# Patient Record
Sex: Female | Born: 1960 | Race: White | Hispanic: No | Marital: Single | Smoking: Never smoker
Health system: Southern US, Community
[De-identification: ages and names within clinical notes are randomized; demographics above are authoritative.]

## PROBLEM LIST (undated history)

## (undated) DIAGNOSIS — K219 Gastro-esophageal reflux disease without esophagitis: Secondary | ICD-10-CM

---

## 2004-09-20 ENCOUNTER — Ambulatory Visit: Payer: Self-pay | Admitting: Obstetrics and Gynecology

## 2004-09-26 ENCOUNTER — Emergency Department: Payer: Self-pay | Admitting: Emergency Medicine

## 2006-02-18 ENCOUNTER — Ambulatory Visit: Payer: Self-pay | Admitting: Obstetrics and Gynecology

## 2006-02-27 ENCOUNTER — Ambulatory Visit: Payer: Self-pay

## 2007-03-18 ENCOUNTER — Ambulatory Visit: Payer: Self-pay | Admitting: Obstetrics and Gynecology

## 2008-03-23 ENCOUNTER — Ambulatory Visit: Payer: Self-pay | Admitting: Obstetrics and Gynecology

## 2009-09-21 ENCOUNTER — Ambulatory Visit: Payer: Self-pay | Admitting: Obstetrics and Gynecology

## 2009-10-02 ENCOUNTER — Ambulatory Visit: Payer: Self-pay | Admitting: Obstetrics and Gynecology

## 2010-04-02 ENCOUNTER — Ambulatory Visit: Payer: Self-pay | Admitting: Obstetrics and Gynecology

## 2010-10-02 ENCOUNTER — Ambulatory Visit: Payer: Self-pay | Admitting: Obstetrics and Gynecology

## 2011-09-11 ENCOUNTER — Ambulatory Visit: Payer: Self-pay

## 2011-09-11 LAB — PREGNANCY, URINE: Pregnancy Test, Urine: NEGATIVE m[IU]/mL

## 2012-11-04 ENCOUNTER — Ambulatory Visit: Payer: Self-pay | Admitting: Obstetrics and Gynecology

## 2013-02-04 ENCOUNTER — Ambulatory Visit: Payer: Self-pay

## 2014-02-11 ENCOUNTER — Ambulatory Visit: Payer: Self-pay | Admitting: Obstetrics and Gynecology

## 2014-03-29 ENCOUNTER — Ambulatory Visit: Payer: Self-pay | Admitting: Physician Assistant

## 2015-07-27 ENCOUNTER — Other Ambulatory Visit: Payer: Self-pay

## 2016-06-03 ENCOUNTER — Other Ambulatory Visit: Payer: Self-pay | Admitting: Obstetrics and Gynecology

## 2016-06-03 DIAGNOSIS — Z1231 Encounter for screening mammogram for malignant neoplasm of breast: Secondary | ICD-10-CM

## 2016-06-05 ENCOUNTER — Ambulatory Visit
Admission: RE | Admit: 2016-06-05 | Discharge: 2016-06-05 | Disposition: A | Discharge status: Home / Self Care | Payer: BC Managed Care – PPO | Source: Ambulatory Visit | Attending: Obstetrics and Gynecology | Admitting: Obstetrics and Gynecology

## 2016-06-05 ENCOUNTER — Encounter: Payer: Self-pay | Admitting: Radiology

## 2016-06-05 DIAGNOSIS — R928 Other abnormal and inconclusive findings on diagnostic imaging of breast: Secondary | ICD-10-CM | POA: Diagnosis not present

## 2016-06-05 DIAGNOSIS — Z1231 Encounter for screening mammogram for malignant neoplasm of breast: Secondary | ICD-10-CM | POA: Diagnosis not present

## 2016-06-10 ENCOUNTER — Other Ambulatory Visit: Payer: Self-pay | Admitting: Obstetrics and Gynecology

## 2016-06-10 DIAGNOSIS — R928 Other abnormal and inconclusive findings on diagnostic imaging of breast: Secondary | ICD-10-CM

## 2016-06-27 ENCOUNTER — Ambulatory Visit: Payer: BC Managed Care – PPO

## 2016-06-27 ENCOUNTER — Other Ambulatory Visit: Payer: BC Managed Care – PPO

## 2016-06-27 HISTORY — PX: BREAST BIOPSY: SHX20

## 2016-07-05 ENCOUNTER — Other Ambulatory Visit: Payer: Self-pay | Admitting: *Deleted

## 2016-07-05 ENCOUNTER — Other Ambulatory Visit: Payer: Self-pay | Admitting: Obstetrics and Gynecology

## 2016-07-05 ENCOUNTER — Inpatient Hospital Stay
Admission: RE | Admit: 2016-07-05 | Discharge: 2016-07-05 | Disposition: A | Discharge status: Home / Self Care | Payer: Self-pay | Source: Ambulatory Visit | Attending: *Deleted | Admitting: *Deleted

## 2016-07-05 DIAGNOSIS — R928 Other abnormal and inconclusive findings on diagnostic imaging of breast: Secondary | ICD-10-CM

## 2016-07-05 DIAGNOSIS — Z9289 Personal history of other medical treatment: Secondary | ICD-10-CM

## 2016-07-16 ENCOUNTER — Ambulatory Visit
Admission: RE | Admit: 2016-07-16 | Discharge: 2016-07-16 | Disposition: A | Discharge status: Home / Self Care | Payer: BC Managed Care – PPO | Source: Ambulatory Visit | Attending: Obstetrics and Gynecology | Admitting: Obstetrics and Gynecology

## 2016-07-16 DIAGNOSIS — N6311 Unspecified lump in the right breast, upper outer quadrant: Secondary | ICD-10-CM | POA: Insufficient documentation

## 2016-07-16 DIAGNOSIS — R928 Other abnormal and inconclusive findings on diagnostic imaging of breast: Secondary | ICD-10-CM

## 2016-07-16 DIAGNOSIS — N6310 Unspecified lump in the right breast, unspecified quadrant: Secondary | ICD-10-CM | POA: Diagnosis present

## 2016-07-17 LAB — SURGICAL PATHOLOGY

## 2017-08-27 ENCOUNTER — Other Ambulatory Visit: Payer: Self-pay | Admitting: Obstetrics and Gynecology

## 2017-08-27 DIAGNOSIS — Z1231 Encounter for screening mammogram for malignant neoplasm of breast: Secondary | ICD-10-CM

## 2017-09-09 ENCOUNTER — Ambulatory Visit
Admission: RE | Admit: 2017-09-09 | Discharge: 2017-09-09 | Disposition: A | Discharge status: Home / Self Care | Payer: BC Managed Care – PPO | Source: Ambulatory Visit | Attending: Obstetrics and Gynecology | Admitting: Obstetrics and Gynecology

## 2017-09-09 DIAGNOSIS — Z1231 Encounter for screening mammogram for malignant neoplasm of breast: Secondary | ICD-10-CM | POA: Insufficient documentation

## 2019-11-03 ENCOUNTER — Other Ambulatory Visit: Payer: Self-pay | Admitting: Obstetrics and Gynecology

## 2019-11-03 DIAGNOSIS — N644 Mastodynia: Secondary | ICD-10-CM

## 2019-11-11 ENCOUNTER — Ambulatory Visit
Admission: RE | Admit: 2019-11-11 | Discharge: 2019-11-11 | Disposition: A | Discharge status: Home / Self Care | Payer: BC Managed Care – PPO | Source: Ambulatory Visit | Attending: Obstetrics and Gynecology | Admitting: Obstetrics and Gynecology

## 2019-11-11 DIAGNOSIS — N644 Mastodynia: Secondary | ICD-10-CM | POA: Insufficient documentation

## 2019-12-07 ENCOUNTER — Encounter: Payer: Self-pay | Admitting: Emergency Medicine

## 2019-12-07 ENCOUNTER — Other Ambulatory Visit: Payer: Self-pay

## 2019-12-07 ENCOUNTER — Ambulatory Visit
Admission: EM | Admit: 2019-12-07 | Discharge: 2019-12-07 | Disposition: A | Discharge status: Home / Self Care | Payer: BC Managed Care – PPO | Attending: Urgent Care | Admitting: Urgent Care

## 2019-12-07 DIAGNOSIS — R030 Elevated blood-pressure reading, without diagnosis of hypertension: Secondary | ICD-10-CM | POA: Diagnosis not present

## 2019-12-07 DIAGNOSIS — R079 Chest pain, unspecified: Secondary | ICD-10-CM

## 2019-12-07 HISTORY — DX: Gastro-esophageal reflux disease without esophagitis: K21.9

## 2019-12-07 LAB — BASIC METABOLIC PANEL
Anion gap: 9 (ref 5–15)
BUN: 14 mg/dL (ref 6–20)
CO2: 27 mmol/L (ref 22–32)
Calcium: 9.3 mg/dL (ref 8.9–10.3)
Chloride: 103 mmol/L (ref 98–111)
Creatinine, Ser: 0.95 mg/dL (ref 0.44–1.00)
GFR calc Af Amer: >60 mL/min (ref >60)
GFR calc non Af Amer: >60 mL/min (ref >60)
Glucose, Bld: 92 mg/dL (ref 70–99)
Potassium: 3.6 mmol/L (ref 3.5–5.1)
Sodium: 139 mmol/L (ref 135–145)

## 2019-12-07 LAB — CBC
HCT: 40.5 % (ref 36.0–46.0)
Hemoglobin: 13.5 g/dL (ref 12.0–15.0)
MCH: 31 pg (ref 26.0–34.0)
MCHC: 33.3 g/dL (ref 30.0–36.0)
MCV: 93.1 fL (ref 80.0–100.0)
Platelets: 341 10*3/uL (ref 150–400)
RBC: 4.35 MIL/uL (ref 3.87–5.11)
RDW: 12.6 % (ref 11.5–15.5)
WBC: 6.9 10*3/uL (ref 4.0–10.5)
nRBC: 0 % (ref 0.0–0.2)

## 2019-12-07 LAB — TROPONIN I (HIGH SENSITIVITY): Troponin I (High Sensitivity): 3 ng/L (ref <18)

## 2019-12-07 NOTE — Discharge Instructions (Signed)
It was very nice seeing you today in clinic. Thank you for entrusting me with your care.   Labs looked good today. Again, I cannot rule out it being your heart in the urgent care setting. If you are still having symptoms, I would recommend you proceeding to the ER tonight for further work up. Monitor your blood pressure at home and follow up with your PCP if it continues to be elevated.   If your symptoms/condition worsens, please seek follow up care either here or in the ER. Please remember, our [facility] providers are "right here with you" when you need [place].   Again, it was my pleasure to take care of you today. Thank you for choosing our clinic. I hope that you start to feel better quickly.   Quentin Mulling, MSN, APRN, FNP-C, CEN Advanced Practice Provider [facility] [place] [facility]

## 2019-12-07 NOTE — ED Provider Notes (Signed)
[place], [place]   Name: Susan Sanchez DOB: 10/22/1960 MRN: [id number] CSN: [id number] PCP: Barbaraann Boys, MD  Arrival date and time:  12/07/19 1744  Chief Complaint:  Arm Pain (left) and Dizziness  NOTE: Prior to seeing the patient today, I have reviewed the triage nursing documentation and vital signs. Clinical staff has updated patient's PMH/PSHx, current medication list, and drug allergies/intolerances to ensure comprehensive history available to assist in medical decision making.   History:   HPI: Susan Sanchez is a 59 y.o. female who presents today with complaints of LEFT sided chest pain that began with acute onset today around noon. Patient describes the pain as having a dull quality. Patient denies any blunt force trauma to her anterior or lateral chest wall. She endorses radiation of the pain into her shoulder and subscapular areas. She denies neck and jaw pain. Patient presents today with concerns that the pain is now radiating into her LEFT upper extremity. She denies associated shortness of breath, vomiting, and diaphoresis. With the onset of the pain, she experienced some mild nausea that has since resolved. Patient does have a history of gastrointestinal reflux. Prior to the onset of her symptoms, patient notes that she was sitting at work and became lightheaded and dizzy. She checked her blood pressure and noted that it was elevated in the 160/80 range; states, "my blood pressure usually runs low". She presents tonight with an elevated blood pressure of 175/88. Pain is not reproducible with movement, palpation, or deep inspiration. Patient indicates that the pain is somewhat relieved/improved by rest. Patient advising that she has never experienced similar episodes of pain in her chest in the past. Patient denies a past medical history significant for any known cardiac problems. Patient does not have a history of any sort of cardiac arrhythmias; no pacemaker or AICD. She  notes that her family history is also negative for MI, CAD, heart failure, VTE, and sudden cardiac death. She has never been diagnosed with a DVT or PE in the past. She is not on daily anticoagulation therapy. Patient does not take aspirin on a daily basis, however did self administer ASA 81 mg earlier today with the onset of her symptoms.   Past Medical History:  Diagnosis Date  . GERD (gastroesophageal reflux disease)     Past Surgical History:  Procedure Laterality Date  . BREAST BIOPSY Right 06/27/2016   stereo- neg    Family History  Problem Relation Age of Onset  . Breast cancer Neg Hx     Social History   Tobacco Use  . Smoking status: Never Smoker  . Smokeless tobacco: Never Used  Substance Use Topics  . Alcohol use: Not Currently  . Drug use: Never    There are no problems to display for this patient.   Home Medications:    No outpatient medications have been marked as taking for the 12/07/19 encounter [facility] Encounter).    Allergies:   Cefixime, Cephalosporins, Clarithromycin, Codeine, Erythromycin, Metronidazole, Sulfa antibiotics, Tetracyclines & related, and Zolpidem  Review of Systems (ROS):  Review of systems NEGATIVE unless otherwise noted in narrative H&P section.   Vital Signs: Today's Vitals   12/07/19 1823 12/07/19 1826 12/07/19 1936  BP:  (!) 175/88   Pulse:  84   Resp:  18   Temp:  98.2 F (36.8 C)   TempSrc:  Oral   SpO2:  99%   Weight: 150 lb (68 kg)    Height: 5\' 7"  (1.702 m)  PainSc: 4   4     Physical Exam: Physical Exam  Constitutional: She is oriented to person, place, and time and well-developed, well-nourished, and in no distress.  HENT:  Head: Normocephalic and atraumatic.  Eyes: Pupils are equal, round, and reactive to light.  Cardiovascular: Normal rate, regular rhythm, normal heart sounds and intact distal pulses.  Pulmonary/Chest: Effort normal and breath sounds normal. She exhibits no tenderness, no crepitus, no  deformity and no swelling.  Abdominal: Soft. Normal appearance, normal aorta and bowel sounds are normal. She exhibits no distension. There is no abdominal tenderness. There is no CVA tenderness.  Musculoskeletal:     Cervical back: Normal range of motion and neck supple.  Neurological: She is alert and oriented to person, place, and time. Gait normal.  Skin: Skin is warm and dry. No rash noted. She is not diaphoretic.  Psychiatric: Memory, affect and judgment normal. Her mood appears anxious.  Nursing note and vitals reviewed.   Urgent Care Treatments / Results:   LABS: PLEASE NOTE: all labs that were ordered this encounter are listed, however only abnormal results are displayed. Labs Reviewed  CBC  BASIC METABOLIC PANEL  TROPONIN I (HIGH SENSITIVITY)    URGENT CARE ECG REPORT Date: 12/08/2019 Time ECG obtained: 1834 PM Rate: 76 bpm Rhythm: normal sinus rhythm Axis (leads I and aVF): normal Intervals: PR 138 ms. QTc 445 ms. ST segment and T wave changes: No evidence of ST segment elevation or depression Comparison: Similar to previous tracing obtained on 09/11/2011; PACs are no longer present on today's tracing.   RADIOLOGY: No results found.  PROCEDURES: Procedures  MEDICATIONS RECEIVED THIS VISIT: Medications - No data to display  PERTINENT CLINICAL COURSE NOTES/UPDATES:   Initial Impression / Assessment and Plan / Urgent Care Course:  Pertinent labs & imaging results that were available during my care of the patient were personally reviewed by me and considered in my medical decision making (see lab/imaging section of note for values and interpretations).  Susan Sanchez is a 59 y.o. female who presents to [facility] today with complaints of Arm Pain (left) and Dizziness  Patient is well appearing overall in clinic today. She does not appear to be in any acute distress. Presenting symptoms (see HPI) and exam as documented above. Symptoms with acute onset  earlier today. Patient with nausea, dizziness, elevated, blood pressure, and pain in her LUE and scapular area. She has no significant PMH. Will pursue further workup in clinic as follows:   EKG shows NSR at a rate of 76 bpm. There is no evidence of ST segment elevation or depression.    CBC, BMP, and hs-TnI all normal/unremarkable.  Discussed results from workup with patient. Given her symptoms, I cannot fully rule out ACS in the urgent care setting. My recommendations are to have her seen in the emergency department for CCM and serial enzyme monitoring. Patient hesitant and advises that she does not want to go. Patient reports that she is physically active, which could potentially account for her LUE and shoulder pain. However, with that being said, I advised her that she need further workup. Patient continues to decline citing that she feels better overall since the onset of her symptoms earlier today. Patient to monitor symptoms at home and proceed to the ER for any new or worsening symptoms tonight. She was provided with the contact information for our local ER on today's AVS.    Regarding her blood pressures. I am unsure why  her blood pressure is elevated. Discussed stress and dietary sodium intake as possible causes. Patient advised to monitor blood pressures at home daily at the same time. Discussed need to maintain a log for PCP review. Given her reports of "normally low" blood pressures, I will avoid initiating any pharmacological interventions tonight. She was encouraged to contact her PCP to discuss further.   Discussed follow up with primary care physician ASAP for re-evaluation. I have reviewed the follow up and strict return precautions for any new or worsening symptoms. Patient is aware of symptoms that would be deemed urgent/emergent, and would thus require further evaluation either here or in the emergency department. At the time of discharge, she verbalized understanding and consent  with the discharge plan as it was reviewed with her. All questions were fielded by provider and/or clinic staff prior to patient discharge.    Final Clinical Impressions / Urgent Care Diagnoses:   Final diagnoses:  Chest pain with normal EKG  Chest pain, unspecified type  Elevated BP without diagnosis of hypertension    New Prescriptions:  [place] Controlled Substance Registry consulted? Not Applicable  No orders of the defined types were placed in this encounter.   Recommended Follow up Care:  Patient encouraged to follow up with the following provider within the specified time frame, or sooner as dictated by the severity of her symptoms. As always, she was instructed that for any urgent/emergent care needs, she should seek care either here or in the emergency department for more immediate evaluation.  Follow-up Information    Schedule an appointment as soon as possible for a visit  with Orene Desanctis, MD.   Specialty: Pediatrics Contact information: [address] [place] [address] [phone]        [facility] EMERGENCY DEPARTMENT. Go today.   Specialty: Emergency Medicine Why: Continue chest pain or any concerning symptoms. Contact information: [address] [id number] [address] [place] [address] [phone]        NOTE: This note was prepared using Dragon dictation software along with smaller phrase technology. Despite my best ability to proofread, there is the potential that transcriptional errors may still occur from this process, and are completely unintentional.    Verlee Monte, NP 12/08/19 2139

## 2019-12-07 NOTE — ED Triage Notes (Signed)
Patient c/o feeling light headed that started around noon today. She states she left work and went home. She is now c/o left arm pain and her BP when she checked it earlier was 167/88.

## 2019-12-08 ENCOUNTER — Encounter: Payer: Self-pay | Admitting: Urgent Care

## 2021-02-22 ENCOUNTER — Other Ambulatory Visit (HOSPITAL_COMMUNITY): Payer: Self-pay | Admitting: Pediatrics

## 2021-02-22 ENCOUNTER — Other Ambulatory Visit: Payer: Self-pay | Admitting: Pediatrics

## 2021-02-22 DIAGNOSIS — E049 Nontoxic goiter, unspecified: Secondary | ICD-10-CM

## 2021-02-22 DIAGNOSIS — E040 Nontoxic diffuse goiter: Secondary | ICD-10-CM

## 2021-03-05 ENCOUNTER — Other Ambulatory Visit: Payer: Self-pay

## 2021-03-05 ENCOUNTER — Ambulatory Visit
Admission: RE | Admit: 2021-03-05 | Discharge: 2021-03-05 | Disposition: A | Discharge status: Home / Self Care | Payer: BC Managed Care – PPO | Source: Ambulatory Visit | Attending: Pediatrics | Admitting: Pediatrics

## 2021-03-05 DIAGNOSIS — E049 Nontoxic goiter, unspecified: Secondary | ICD-10-CM | POA: Insufficient documentation

## 2021-03-05 DIAGNOSIS — E040 Nontoxic diffuse goiter: Secondary | ICD-10-CM

## 2021-05-15 IMAGING — MG DIGITAL DIAGNOSTIC BILAT W/ TOMO W/ CAD
8 series · 9 of 24 positions shown · non-contrast
Comparison: Previous exam(s).

CLINICAL DATA: 58-year-old female with diffuse OUTER RIGHT breast
pain. Also for annual bilateral mammogram.

EXAM:
DIGITAL DIAGNOSTIC BILATERAL MAMMOGRAM WITH CAD AND TOMO

[R CC synth-2D]
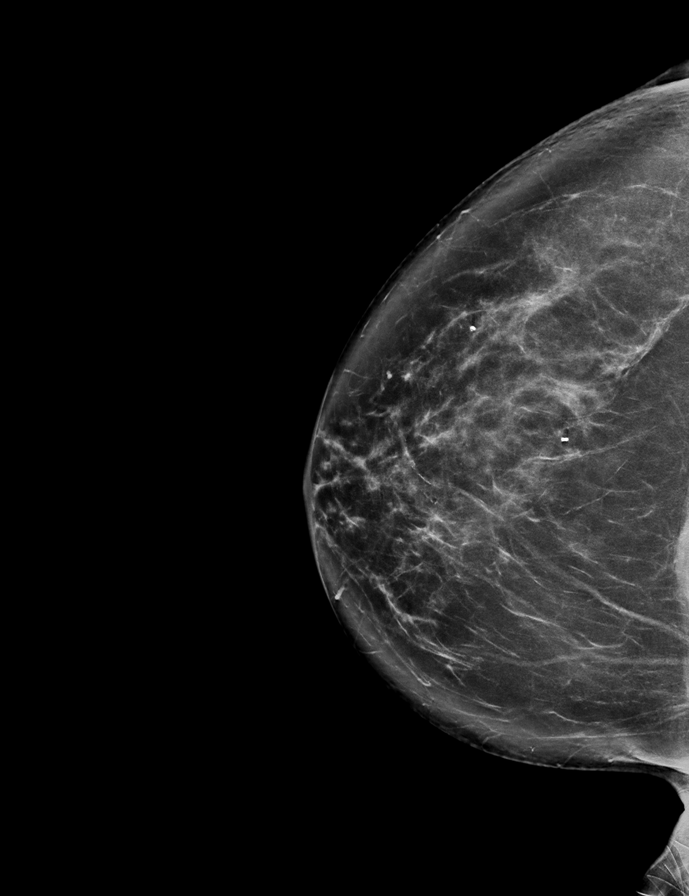

[L MLO synth-2D]
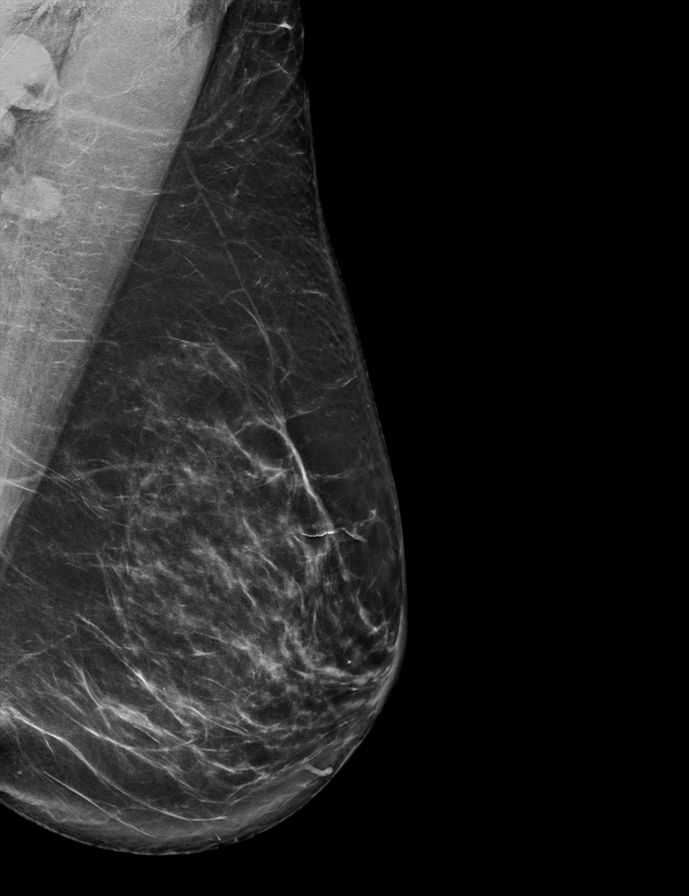

[R MLO synth-2D]
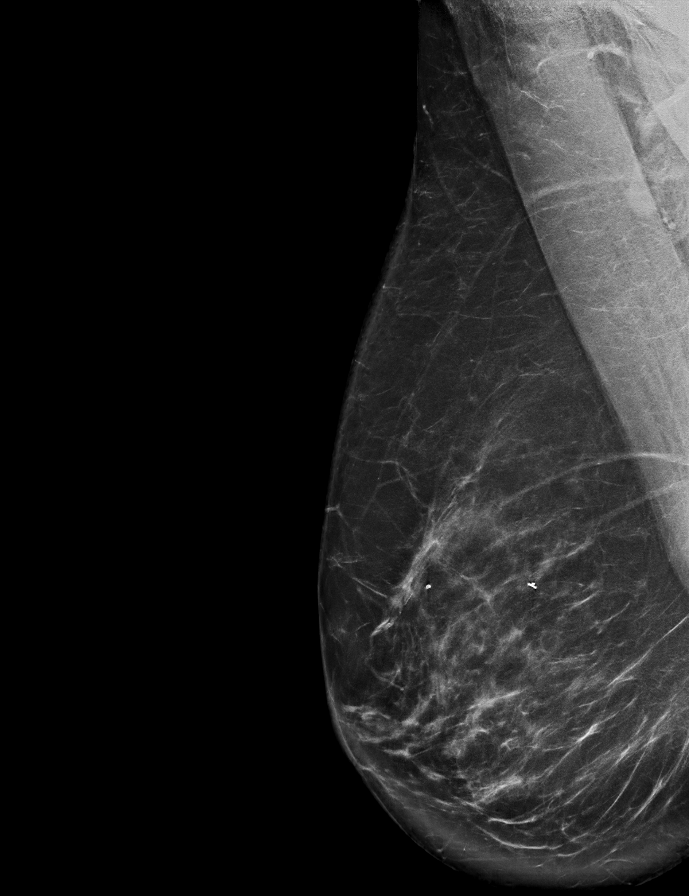

[L CC synth-2D]
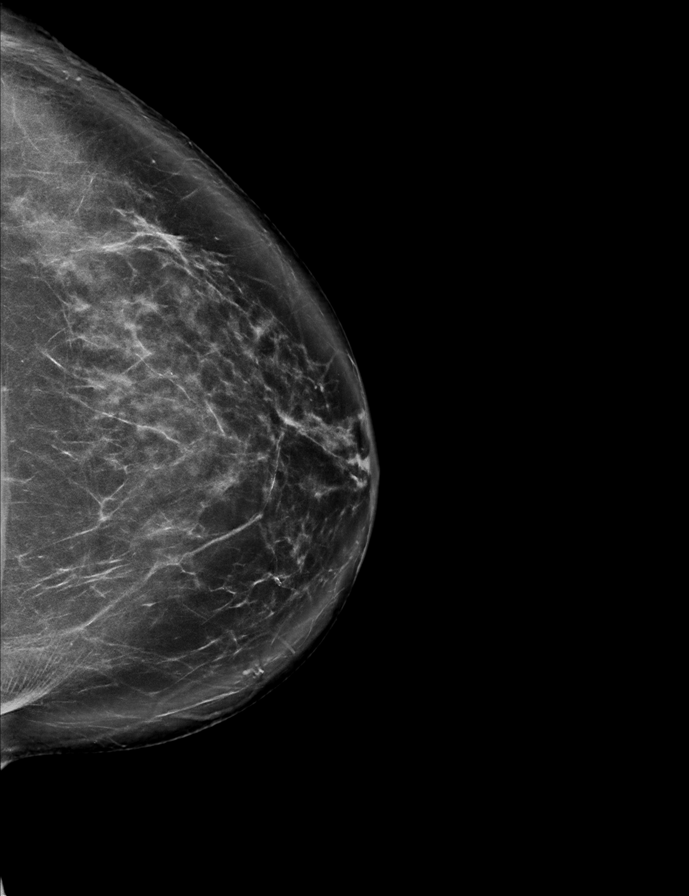

[R CC tomo · 2 of 83 frames shown]
[frame 27/83]
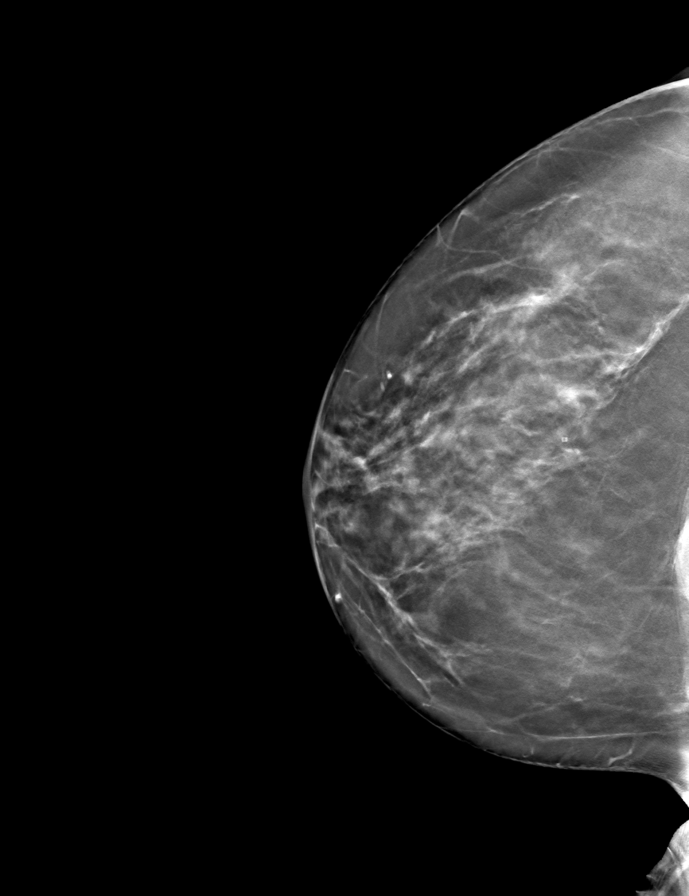
[frame 42/83]
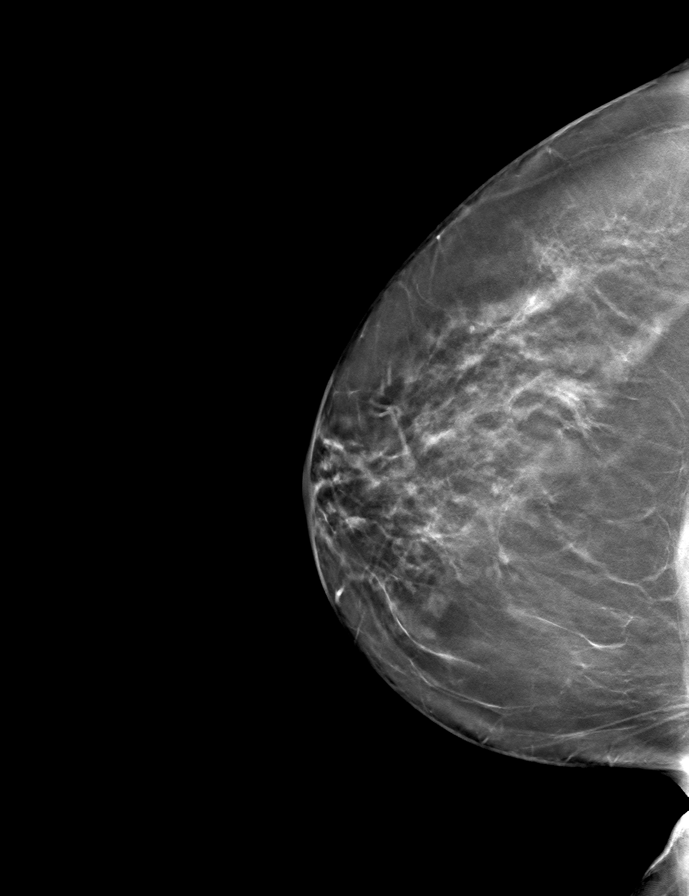

[R MLO tomo · tomo slice 46/91.0]
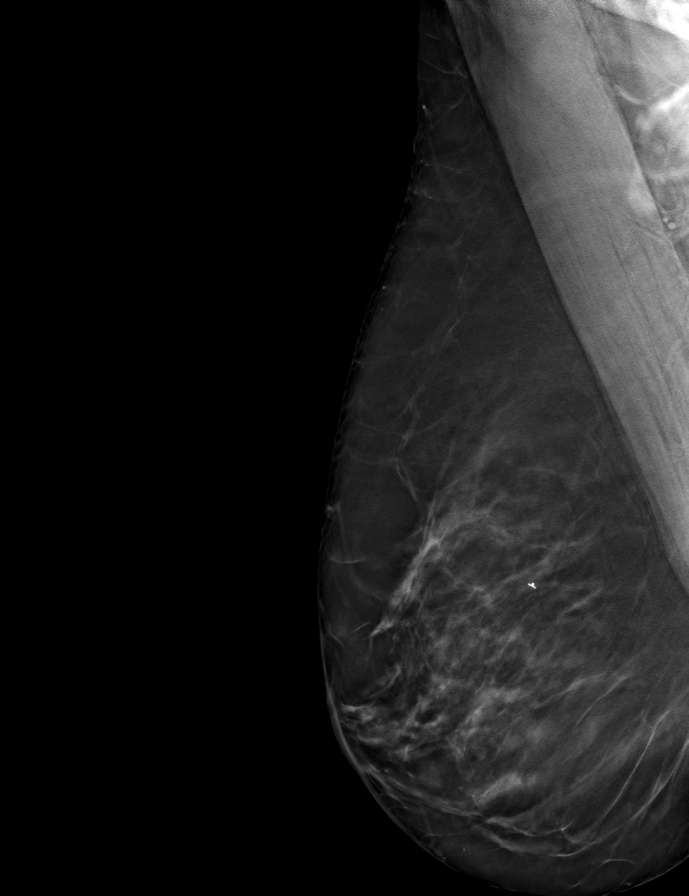

[L MLO tomo · tomo slice 41/82.0]
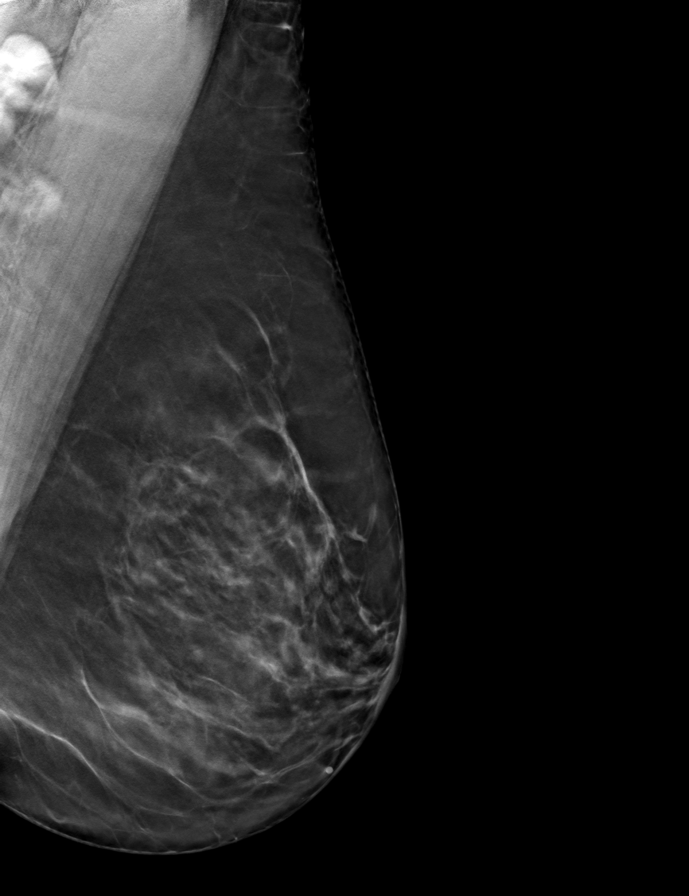

[L CC tomo · tomo slice 44/87.0]
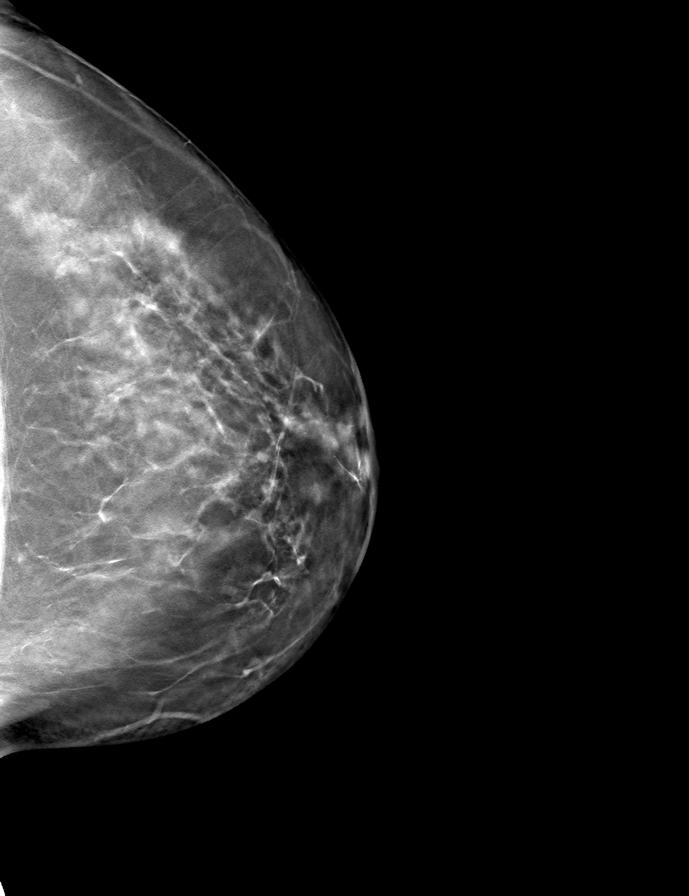

[9 of 24 positions shown; findings below may reference images not displayed]

ACR Breast Density Category b: There are scattered areas of
fibroglandular density.
FINDINGS: 2D/3D full field views of both breasts demonstrate no suspicious
mass, distortion or worrisome calcifications.

A biopsy clip within the UPPER-OUTER RIGHT breast is again
identified.

Mammographic images were processed with CAD.
IMPRESSION: No mammographic evidence of breast malignancy.

RECOMMENDATION:
Bilateral screening mammogram in 1 year.

Consider clinical follow-up as indicated. Any further workup should
be based on clinical grounds.

I have discussed the findings, causes of breast pain and
recommendations with the patient. If applicable, a reminder letter
will be sent to the patient regarding the next appointment.

BI-RADS CATEGORY  1: Negative.

## 2022-03-18 ENCOUNTER — Other Ambulatory Visit: Payer: Self-pay | Admitting: Pediatrics

## 2022-03-18 DIAGNOSIS — Z1231 Encounter for screening mammogram for malignant neoplasm of breast: Secondary | ICD-10-CM

## 2022-04-08 ENCOUNTER — Ambulatory Visit
Admission: RE | Admit: 2022-04-08 | Discharge: 2022-04-08 | Disposition: A | Discharge status: Home / Self Care | Payer: BC Managed Care – PPO | Source: Ambulatory Visit | Attending: Pediatrics | Admitting: Pediatrics

## 2022-04-08 DIAGNOSIS — Z1231 Encounter for screening mammogram for malignant neoplasm of breast: Secondary | ICD-10-CM | POA: Insufficient documentation

## 2022-09-07 IMAGING — US US THYROID
1 series · 13 of 25 positions shown · non-contrast
Comparison: None.

CLINICAL DATA: Goiter.

EXAM:
THYROID ULTRASOUND
TECHNIQUE: Ultrasound examination of the thyroid gland and adjacent soft
tissues was performed.

[Series 1: us thyroid · 0.07mm/px · 70 acquisitions, 13 frames shown]
[im 1/70]
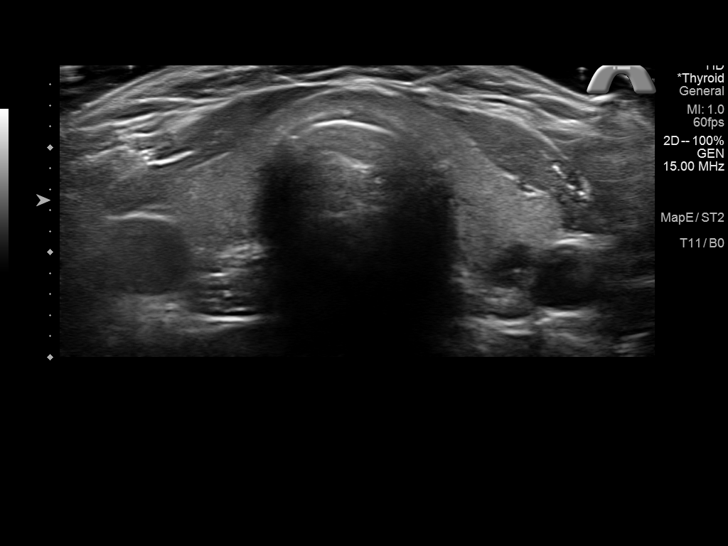
[im 6/70]
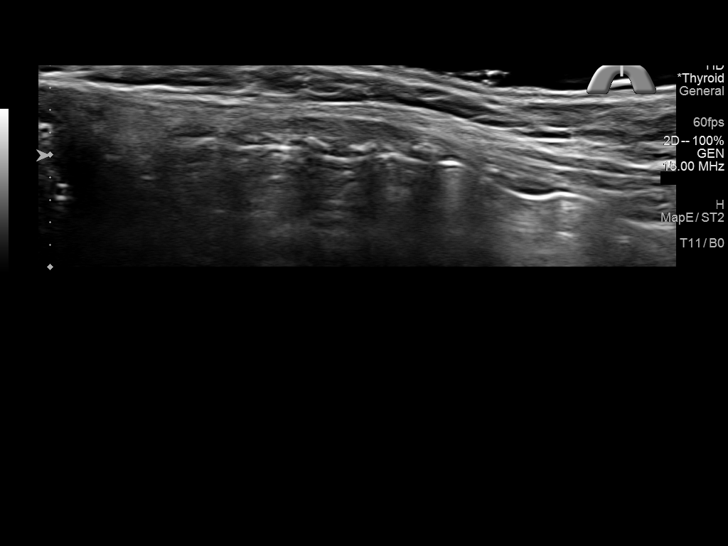
[im 12/70]
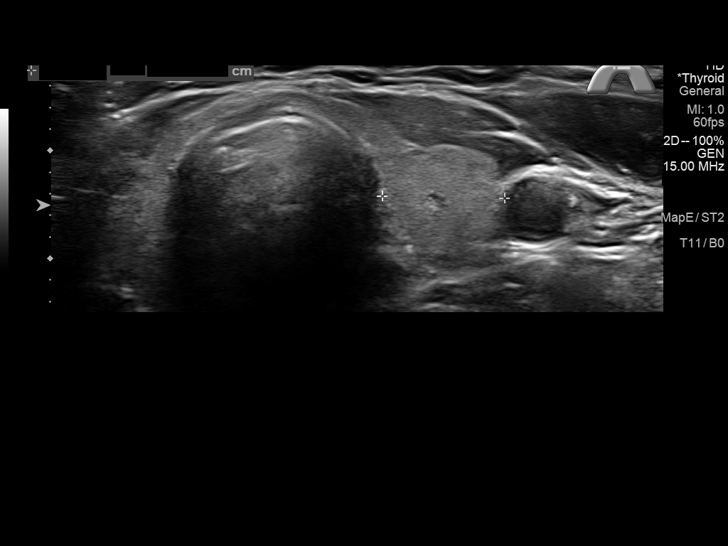
[im 18/70]
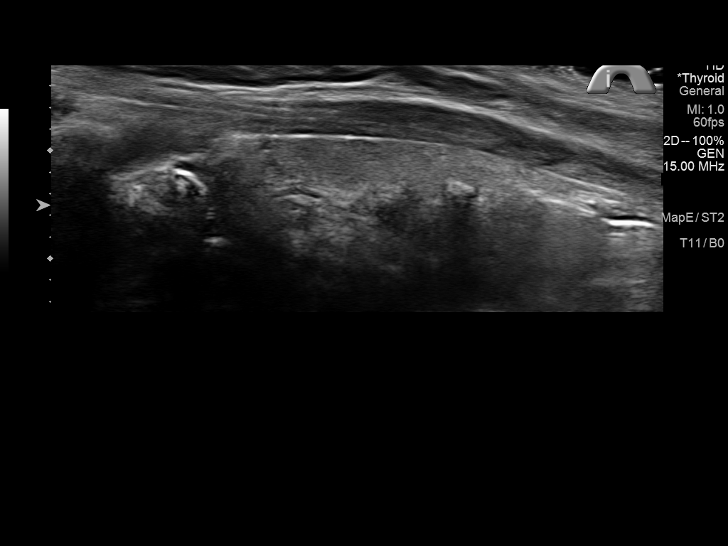
[im 24/70]
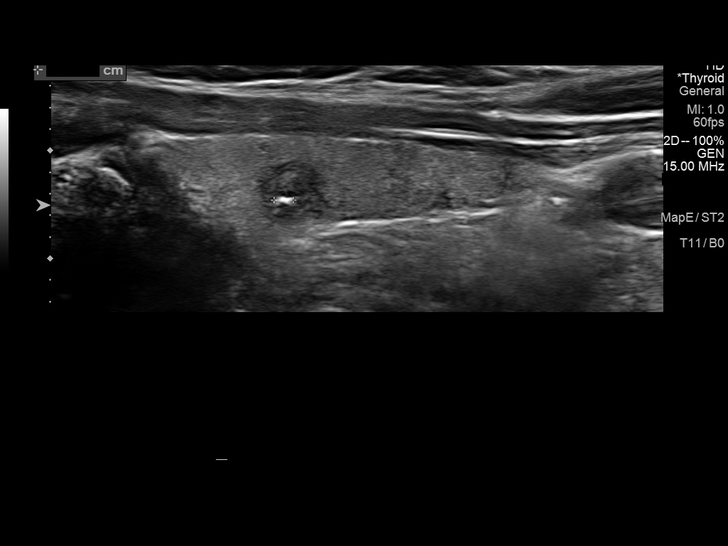
[im 29/70]
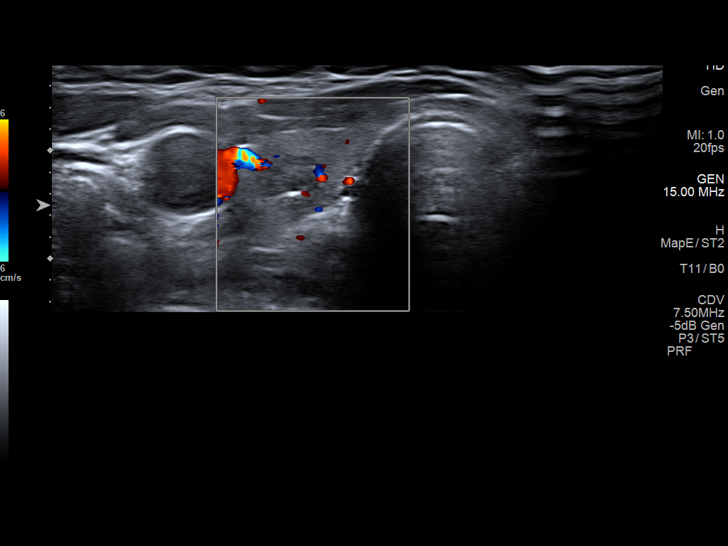
[im 35/70]
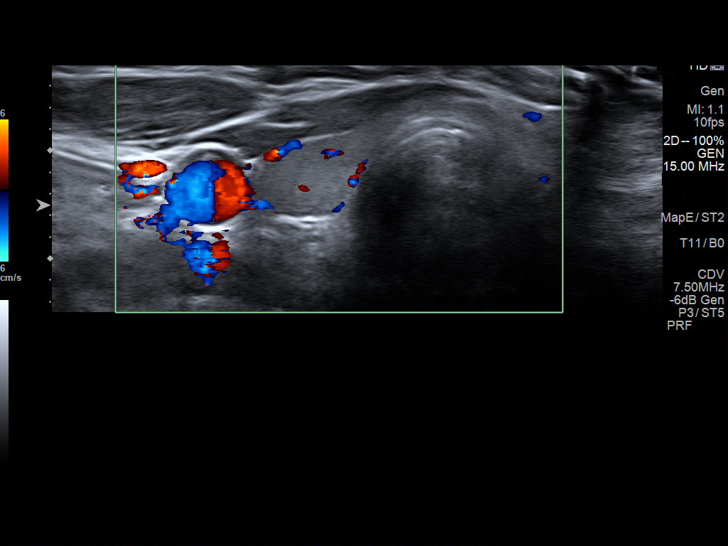
[im 41/70]
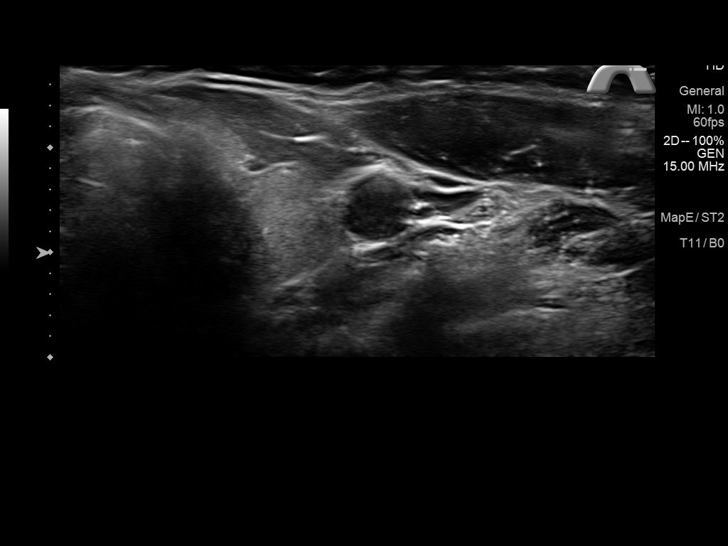
[im 47/70]
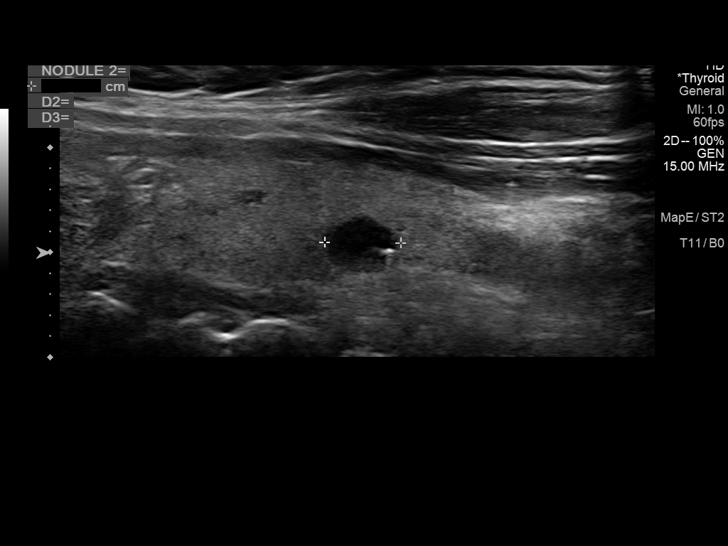
[im 52/70]
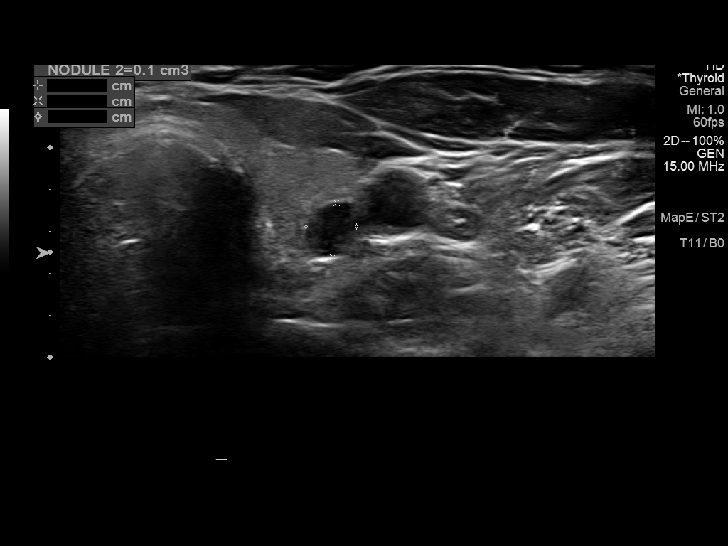
[im 58/70]
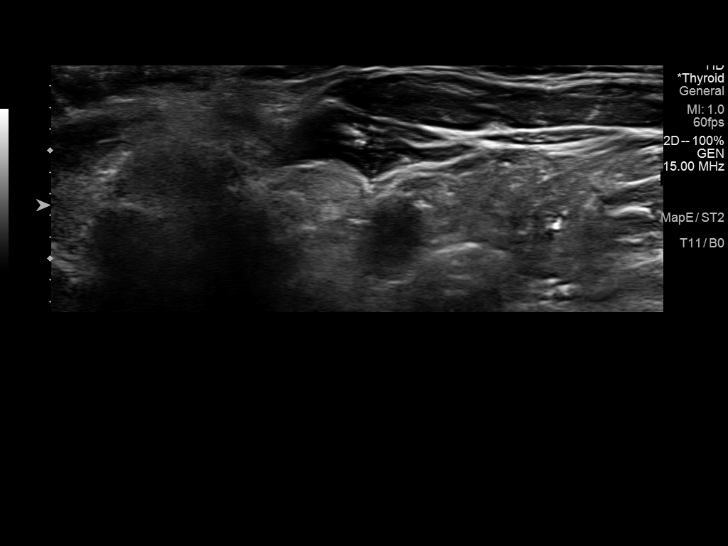
[im 64/70]
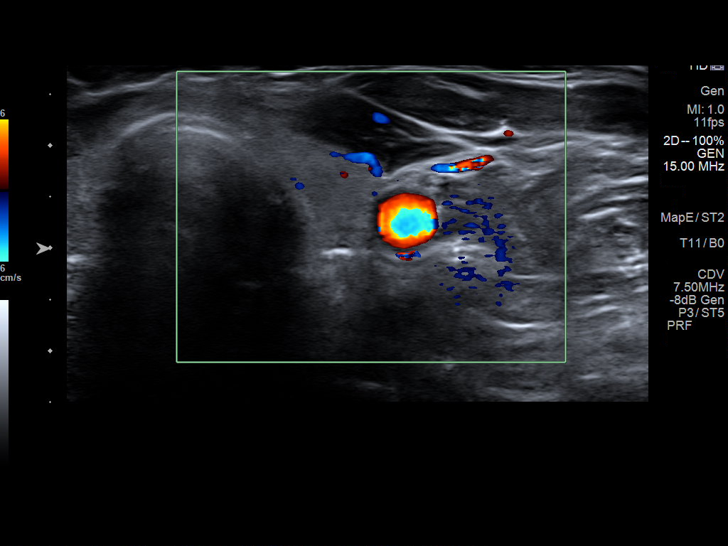
[im 70/70]
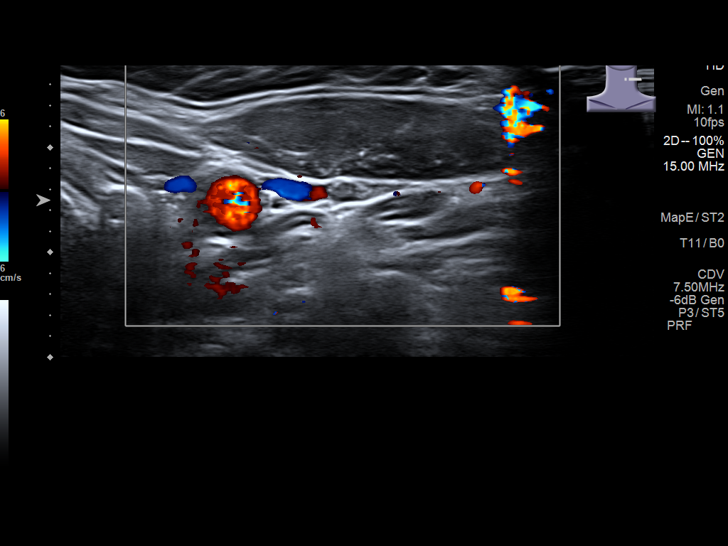

[13 of 25 positions shown; findings below may reference images not displayed]

FINDINGS: Parenchymal Echotexture: Mildly heterogenous

Isthmus: 0.2 cm

Right lobe: 4.0 x 1.1 x 1.0 cm

Left lobe: 4.0 x 1.2 x 1.1 cm

_________________________________________________________

Estimated total number of nodules >/= 1 cm: 0

Number of spongiform nodules >/=  2 cm not described below (TR1): 0

Number of mixed cystic and solid nodules >/= 1.5 cm not described
below (TR2): 0

_________________________________________________________

Nodule # 1:

Location: Right; Mid

Maximum size: 0.6 cm; Other 2 dimensions: 0.6 x 0.5 cm

Composition: solid/almost completely solid (2)

Echogenicity: hypoechoic (2)

Shape: not taller-than-wide (0)

Margins: smooth (0)

Echogenic foci: macrocalcifications (1)

ACR TI-RADS total points: 5.

ACR TI-RADS risk category: TR4 (4-6 points).

ACR TI-RADS recommendations:

Given size (<0.9 cm) and appearance, this nodule does NOT meet
TI-RADS criteria for biopsy or dedicated follow-up.

_________________________________________________________

Nodule # 2:

Location: Left; Mid

Maximum size: 0.7 cm; Other 2 dimensions: 0.5 x 0.5 cm

Composition: cystic/almost completely cystic (0)

Echogenicity: anechoic (0)

Shape: not taller-than-wide (0)

Margins: smooth (0)

Echogenic foci: macrocalcifications (1)

ACR TI-RADS total points: 1.

ACR TI-RADS risk category: TR1 (0-1 points).

ACR TI-RADS recommendations:

This nodule does NOT meet TI-RADS criteria for biopsy or dedicated
follow-up.

_________________________________________________________

Nodule # 3:

Location: Left; Inferior

Maximum size: 0.5 cm; Other 2 dimensions: 0.5 x 0.4 cm

Composition: solid/almost completely solid (2)

Echogenicity: hyperechoic (1)

Shape: not taller-than-wide (0)

Margins: ill-defined (0)

Echogenic foci: none (0)

ACR TI-RADS total points: 3.

ACR TI-RADS risk category: TR3 (3 points).

ACR TI-RADS recommendations:

Given size (<1.4 cm) and appearance, this nodule does NOT meet
TI-RADS criteria for biopsy or dedicated follow-up.

_________________________________________________________

No cervical lymphadenopathy.
IMPRESSION: Scattered bilateral benign-appearing thyroid nodules within an
otherwise normal appearing thyroid gland. The visualized nodules are
not meet criteria for dedicated ultrasound follow-up or tissue
sampling.

The above is in keeping with the ACR TI-RADS recommendations - [HOSPITAL] 9533;[DATE].

## 2023-04-07 ENCOUNTER — Other Ambulatory Visit: Payer: Self-pay | Admitting: Pediatrics

## 2023-04-07 DIAGNOSIS — Z1231 Encounter for screening mammogram for malignant neoplasm of breast: Secondary | ICD-10-CM

## 2023-04-18 ENCOUNTER — Ambulatory Visit
Admission: RE | Admit: 2023-04-18 | Discharge: 2023-04-18 | Disposition: A | Discharge status: Home / Self Care | Payer: BC Managed Care – PPO | Source: Ambulatory Visit | Attending: Pediatrics | Admitting: Pediatrics

## 2023-04-18 DIAGNOSIS — Z1231 Encounter for screening mammogram for malignant neoplasm of breast: Secondary | ICD-10-CM | POA: Diagnosis not present

## 2024-03-23 ENCOUNTER — Other Ambulatory Visit: Payer: Self-pay | Admitting: Pediatrics

## 2024-03-23 DIAGNOSIS — Z1231 Encounter for screening mammogram for malignant neoplasm of breast: Secondary | ICD-10-CM

## 2024-05-04 ENCOUNTER — Ambulatory Visit
Admission: RE | Admit: 2024-05-04 | Discharge: 2024-05-04 | Disposition: A | Discharge status: Home / Self Care | Payer: Self-pay | Source: Ambulatory Visit | Attending: Pediatrics | Admitting: Pediatrics

## 2024-05-04 DIAGNOSIS — Z1231 Encounter for screening mammogram for malignant neoplasm of breast: Secondary | ICD-10-CM | POA: Insufficient documentation

## 2024-06-10 ENCOUNTER — Telehealth: Admitting: Nurse Practitioner

## 2024-06-10 DIAGNOSIS — J019 Acute sinusitis, unspecified: Secondary | ICD-10-CM

## 2024-06-10 DIAGNOSIS — B9689 Other specified bacterial agents as the cause of diseases classified elsewhere: Secondary | ICD-10-CM | POA: Diagnosis not present

## 2024-06-10 MED ORDER — AMOXICILLIN-POT CLAVULANATE 875-125 MG PO TABS: 1.0000 | ORAL_TABLET | Freq: Two times a day (BID) | ORAL | 0 refills | Status: AC

## 2024-06-10 NOTE — Patient Instructions (Signed)
  Susan Sanchez, thank you for joining Haze LELON Servant, NP for today's virtual visit.  While this provider is not your primary care provider (PCP), if your PCP is located in our provider database this encounter information will be shared with them immediately following your visit.   A [facility] MyChart account gives you access to today's visit and all your visits, tests, and labs performed at [facility]  click here if you don't have a [facility] MyChart account or go to [email or web address]  Consent: (Patient) Susan Sanchez provided verbal consent for this virtual visit at the beginning of the encounter.  Current Medications:  Current Outpatient Medications:    amoxicillin-clavulanate (AUGMENTIN) 875-125 MG tablet, Take 1 tablet by mouth 2 (two) times daily for 7 days., Disp: 14 tablet, Rfl: 0   omeprazole (PRILOSEC) 10 MG capsule, Take by mouth., Disp: , Rfl:    Medications ordered in this encounter:  Meds ordered this encounter  Medications   amoxicillin-clavulanate (AUGMENTIN) 875-125 MG tablet    Sig: Take 1 tablet by mouth 2 (two) times daily for 7 days.    Dispense:  14 tablet    Refill:  0    Patient states she can take augmentin. NO allergy    Supervising Provider:   BLAISE ALEENE KIDD [[id number]]     *If you need refills on other medications prior to your next appointment, please contact your pharmacy*  Follow-Up: Call back or seek an in-person evaluation if the symptoms worsen or if the condition fails to improve as anticipated.  [facility] [phone]  Other Instructions INSTRUCTIONS: use a humidifier for nasal congestion Drink plenty of fluids, rest and wash hands frequently to avoid the spread of infection Alternate tylenol and Motrin for relief of fever    If you have been instructed to have an in-person evaluation today at a local Urgent Care facility, please use the link below. It will take you to a list of all of  our available [facility], including address, phone number and hours of operation. Please do not delay care.  [facility]  If you or a family member do not have a primary care provider, use the link below to schedule a visit and establish care. When you choose a [facility] primary care physician or advanced practice provider, you gain a long-term partner in health. Find a Primary Care Provider  Learn more about [facility]'s in-office and virtual care options:  - Get Care Now

## 2024-06-10 NOTE — Progress Notes (Signed)
 Virtual Visit Consent   Susan Sanchez, you are scheduled for a virtual visit with a [facility] provider today. Just as with appointments in the office, your consent must be obtained to participate. Your consent will be active for this visit and any virtual visit you may have with one of our providers in the next 365 days. If you have a MyChart account, a copy of this consent can be sent to you electronically.  As this is a virtual visit, video technology does not allow for your provider to perform a traditional examination. This may limit your provider's ability to fully assess your condition. If your provider identifies any concerns that need to be evaluated in person or the need to arrange testing (such as labs, EKG, etc.), we will make arrangements to do so. Although advances in technology are sophisticated, we cannot ensure that it will always work on either your end or our end. If the connection with a video visit is poor, the visit may have to be switched to a telephone visit. With either a video or telephone visit, we are not always able to ensure that we have a secure connection.  By engaging in this virtual visit, you consent to the provision of healthcare and authorize for your insurance to be billed (if applicable) for the services provided during this visit. Depending on your insurance coverage, you may receive a charge related to this service.  I need to obtain your verbal consent now. Are you willing to proceed with your visit today? Susan Sanchez has provided verbal consent on 06/10/2024 for a virtual visit (video or telephone). Susan LELON Servant, NP  Date: 06/10/2024 4:57 PM   Virtual Visit via Video Note   I, Susan Sanchez, connected with  Susan Sanchez  ([id number], [birth year]) on 06/10/24 at  4:45 PM EDT by a video-enabled telemedicine application and verified that I am speaking with the correct person using two identifiers.  Location: Patient: Virtual Visit  Location Patient: Home Provider: Virtual Visit Location Provider: Home Office   I discussed the limitations of evaluation and management by telemedicine and the availability of in person appointments. The patient expressed understanding and agreed to proceed.    History of Present Illness: Susan Sanchez is a 63 y.o. who identifies as a female who was assigned female at birth, and is being seen today for URI symptoms.  Over the past 6 days Susan Sanchez has been experiencing symptoms of cough, fever low-grade, body aches, runny nose and nasal congestion, headaches, frontal and maxillary sinus pressure.  COVID and FLU TEST NEGATIVE She notes previous symptoms of sinusitis for which she was treated with an antibiotic are similar to current symptoms.  She has been using her azelastine nasal spray and over-the-counter cough suppressants such as Mucinex with no relief.     Problems: There are no active problems to display for this patient.   Allergies:  Allergies  Allergen Reactions   Cefixime    Cephalosporins    Clarithromycin    Codeine    Erythromycin    Metronidazole    Sulfa Antibiotics    Tetracyclines & Related    Zolpidem    Medications:  Current Outpatient Medications:    amoxicillin-clavulanate (AUGMENTIN) 875-125 MG tablet, Take 1 tablet by mouth 2 (two) times daily for 7 days., Disp: 14 tablet, Rfl: 0   omeprazole (PRILOSEC) 10 MG capsule, Take by mouth., Disp: , Rfl:   Observations/Objective: Patient is well-developed, well-nourished in no  acute distress.  Resting comfortably at home.  Head is normocephalic, atraumatic.  No labored breathing. Speech is clear and coherent with logical content.  Patient is alert and oriented at baseline.    Assessment and Plan: 1. Acute bacterial sinusitis (Primary) - amoxicillin-clavulanate (AUGMENTIN) 875-125 MG tablet; Take 1 tablet by mouth 2 (two) times daily for 7 days.  Dispense: 14 tablet; Refill: 0  INSTRUCTIONS: use a  humidifier for nasal congestion Drink plenty of fluids, rest and wash hands frequently to avoid the spread of infection Alternate tylenol and Motrin for relief of fever   Follow Up Instructions: I discussed the assessment and treatment plan with the patient. The patient was provided an opportunity to ask questions and all were answered. The patient agreed with the plan and demonstrated an understanding of the instructions.  A copy of instructions were sent to the patient via MyChart unless otherwise noted below.    The patient was advised to call back or seek an in-person evaluation if the symptoms worsen or if the condition fails to improve as anticipated.    Susan Hoogland W Cambria Osten, NP
